# Patient Record
Sex: Male | Born: 1975
Health system: Southern US, Community
[De-identification: ages and names within clinical notes are randomized; demographics above are authoritative.]

---

## 2018-01-01 ENCOUNTER — Telehealth: Payer: Self-pay | Admitting: *Deleted

## 2018-01-01 ENCOUNTER — Encounter: Payer: Self-pay | Admitting: Emergency Medicine

## 2018-01-01 ENCOUNTER — Other Ambulatory Visit: Payer: Self-pay

## 2018-01-01 ENCOUNTER — Ambulatory Visit (INDEPENDENT_AMBULATORY_CARE_PROVIDER_SITE_OTHER): Payer: 59 | Admitting: Emergency Medicine

## 2018-01-01 ENCOUNTER — Ambulatory Visit (INDEPENDENT_AMBULATORY_CARE_PROVIDER_SITE_OTHER): Payer: 59

## 2018-01-01 VITALS — BP 131/87 | HR 83 | Temp 98.8°F | Resp 16 | Ht 70.25 in | Wt 238.2 lb

## 2018-01-01 DIAGNOSIS — S92902A Unspecified fracture of left foot, initial encounter for closed fracture: Secondary | ICD-10-CM

## 2018-01-01 DIAGNOSIS — S99922A Unspecified injury of left foot, initial encounter: Secondary | ICD-10-CM

## 2018-01-01 NOTE — Patient Instructions (Addendum)
IF you received an x-ray today, you will receive an invoice from [facility]. Please contact [facility] at [phone] with questions or concerns regarding your invoice.   IF you received labwork today, you will receive an invoice from [facility]. Please contact [facility] at [phone] with questions or concerns regarding your invoice.   Our billing staff will not be able to assist you with questions regarding bills from these companies.  You will be contacted with the lab results as soon as they are available. The fastest way to get your results is to activate your My Chart account. Instructions are located on the last page of this paperwork. If you have not heard from us regarding the results in 2 weeks, please contact this office.    Cuneiform Fracture A cuneiform fracture is a break in a cuneiform bone. Cuneiform bones are the bones in the middle of the foot that make up the arch. What are the causes? This type of fracture may be caused by:  A sudden twisting of your foot.  A fall onto your foot.  Dropping a heavy object on your foot.  Overuse or repetitive exercise.  What increases the risk? This condition is more likely to develop in people who:  Play contact sports.  Have a bone disease.  Have a low calcium level.  What are the signs or symptoms? Symptoms of this condition include:  Pain. The pain may be worse when you walk, stand, or move your foot.  Swelling, especially on the top of the foot.  Bruising, especially on the bottom of the foot.  How is this diagnosed? This condition is diagnosed with a physical exam. You may also have imaging tests, such as:  X-rays.  A CT scan.  MRI.  How is this treated? Treatment for this condition depends on its severity and whether a bone has moved out of place. Treatment may involve:  Rest.  Wearing foot support such as a cast, splint, boot, or brace for several weeks.  Using  crutches.  Surgery to move bones back into the right position. Surgery is usually needed if there are many pieces of broken bone or bones that are very out of place (displaced fracture).  Physical therapy. This may be needed to help you regain full movement and strength in your foot.  You will need to return to your health care provider to have X-rays taken until your bones heal. Your health care provider will look at the X-rays to make sure that your foot is healing well. Follow these instructions at home: If you have a cast:  Do not stick anything inside the cast to scratch your skin. Doing that increases your risk of infection.  Check the skin around the cast every day. Report any concerns to your health care provider. You may put lotion on dry skin around the edges of the cast. Do not apply lotion to the skin underneath the cast.  Keep the cast clean and dry. If You Have Other Foot Support :  Wear it as directed by your health care provider. Remove it only as directed by your health care provider.  Loosen it if your toes become numb and tingle, or if they turn cold and blue.  Keep it clean and dry. Bathing  Do not take baths, swim, or use a hot tub until your health care provider approves. Ask your health care provider if you can take showers. You may only be allowed to take sponge baths  for bathing.  If your health care provider approves bathing and showering, cover the cast or splint with a watertight plastic bag to protect it from water. Do not let the cast or splint get wet. Managing pain, stiffness, and swelling  If directed, apply ice to the injured area (if you have a splint, not a cast). ? Put ice in a plastic bag. ? Place a towel between your skin and the bag. ? Leave the ice on for 20 minutes, 2-3 times per day.  Move your toes often to avoid stiffness and to lessen swelling.  Raise (elevate) the injured area above the level of your heart while you are sitting or  lying down. Driving  Do not drive or operate heavy machinery while taking pain medicine.  Do not drive while wearing foot support on a foot that you use for driving. Activity  Return to your normal activities as directed by your health care provider. Ask your health care provider what activities are safe for you.  Perform exercises as directed by your health care provider or physical therapist. Safety  Do not use the injured foot to support your body weight until your health care provider says that you can. Use crutches as directed by your health care provider. General instructions  Do not put pressure on any part of the cast or splint until it is fully hardened. This may take several hours.  Do not use any tobacco products, including cigarettes, chewing tobacco, or e-cigarettes. Tobacco can delay bone healing. If you need help quitting, ask your health care provider.  Take medicines only as directed by your health care provider.  Keep all follow-up visits as directed by your health care provider. This is important. Contact a health care provider if:  You have a fever.  Your cast, splint, boot, or brace is too loose or too tight.  Your cast, splint, boot, or brace is damaged.  Your pain medicine is not helping. Get help right away if:  You have severe pain.  You have tingling or numbness in your foot that is getting worse.  You have redness or inflammation in your foot that is getting worse.  Your foot or your lower leg swells up.  Your foot feels cold or becomes numb.  Your foot changes color. This information is not intended to replace advice given to you by your health care provider. Make sure you discuss any questions you have with your health care provider. Document Released: 06/16/2002 Document Revised: 03/01/2016 Document Reviewed: 07/22/2014 Elsevier Interactive Patient Education  2018 [employer].

## 2018-01-01 NOTE — Telephone Encounter (Signed)
Faxed Rx for post-op shoe to [facility]. Conformation page received at 3:50 pm.

## 2018-01-01 NOTE — Telephone Encounter (Signed)
[facility] calling in regards to an order they received for pt on a post op shoe. They do not provide this equipment.

## 2018-01-01 NOTE — Progress Notes (Signed)
Daily Crate 42 y.o.   Chief Complaint  Patient presents with  . Fall    12/31/2017  . Foot Injury    LEFT FOOT    HISTORY OF PRESENT ILLNESS: This is a 42 y.o. male injured left foot yesterday complaining of pain and swelling since.  Foot Injury   The incident occurred 12 to 24 hours ago. The injury mechanism was a twisting injury. The pain is present in the left foot. The quality of the pain is described as aching. The pain is at a severity of 4/10. The pain is moderate. The pain has been constant since onset. Associated symptoms include an inability to bear weight. Pertinent negatives include no loss of sensation, numbness or tingling. The symptoms are aggravated by weight bearing. He has tried elevation, ice, non-weight bearing and rest for the symptoms. The treatment provided mild relief.     Prior to Admission medications   Medication Sig Start Date End Date Taking? Authorizing Provider  ibuprofen (ADVIL,MOTRIN) 200 MG tablet Take 200 mg by mouth as needed.   Yes [provider]    No Known Allergies  There are no active problems to display for this patient.   No past medical history on file.    Social History   Socioeconomic History  . Marital status: Married    Spouse name: Not on file  . Number of children: Not on file  . Years of education: Not on file  . Highest education level: Not on file  Occupational History  . Not on file  Social Needs  . Financial resource strain: Not on file  . Food insecurity:    Worry: Not on file    Inability: Not on file  . Transportation needs:    Medical: Not on file    Non-medical: Not on file  Tobacco Use  . Smoking status: Never Smoker  . Smokeless tobacco: Never Used  Substance and Sexual Activity  . Alcohol use: Yes    Alcohol/week: 6.0 oz    Types: 10 Standard drinks or equivalent per week  . Drug use: Never  . Sexual activity: Not on file  Lifestyle  . Physical activity:    Days per week: Not on  file    Minutes per session: Not on file  . Stress: Not on file  Relationships  . Social connections:    Talks on phone: Not on file    Gets together: Not on file    Attends religious service: Not on file    Active member of club or organization: Not on file    Attends meetings of clubs or organizations: Not on file    Relationship status: Not on file  . Intimate partner violence:    Fear of current or ex partner: Not on file    Emotionally abused: Not on file    Physically abused: Not on file    Forced sexual activity: Not on file  Other Topics Concern  . Not on file  Social History Narrative  . Not on file    No family history on file.   Review of Systems  Constitutional: Negative.  Negative for chills and fever.  Respiratory: Negative for shortness of breath.   Gastrointestinal: Negative for nausea and vomiting.  Musculoskeletal: Positive for joint pain (left foot).  Neurological: Negative for tingling, numbness and headaches.  All other systems reviewed and are negative.    Physical Exam  Constitutional: He is oriented to person, place, and time. He appears  well-developed and well-nourished.  HENT:  Head: Normocephalic.  Eyes: Pupils are equal, round, and reactive to light. EOM are normal.  Neck: Normal range of motion.  Pulmonary/Chest: Effort normal.  Musculoskeletal:  Left foot: Positive swelling with tenderness some mild bruising mostly to lateral aspect.  Full range of motion.  NVI.  Neurological: He is alert and oriented to person, place, and time.  Vitals reviewed.   Dg Foot Complete Left  Result Date: 01/01/2018 CLINICAL DATA:  Left foot injury. EXAM: LEFT FOOT - COMPLETE 3+ VIEW COMPARISON:  None. FINDINGS: Probable mildly displaced fracture involving the medial cuneiform bone. There is no evidence of arthropathy or other focal bone abnormality. Soft tissues are unremarkable. IMPRESSION: Probable mildly displaced fracture involving medial cuneiform bone.  Clinical correlation is recommended. Electronically Signed   By: Lupita Raider, M.D.   On: 01/01/2018 14:26    ASSESSMENT & PLAN: Marquee was seen today for fall and foot injury.  Diagnoses and all orders for this visit:  Injury of left foot, initial encounter -     DG Foot Complete Left; Future  Foot fracture, left, closed, initial encounter -     Ambulatory referral to Orthopedic Surgery -     Post-op shoe -     Apply ace wrap   Patient Instructions       IF you received an x-ray today, you will receive an invoice from [facility]. Please contact [facility] at [phone] with questions or concerns regarding your invoice.   IF you received labwork today, you will receive an invoice from [facility]. Please contact [facility] at [phone] with questions or concerns regarding your invoice.   Our billing staff will not be able to assist you with questions regarding bills from these companies.  You will be contacted with the lab results as soon as they are available. The fastest way to get your results is to activate your My Chart account. Instructions are located on the last page of this paperwork. If you have not heard from [place] regarding the results in 2 weeks, please contact this office.    Cuneiform Fracture A cuneiform fracture is a break in a cuneiform bone. Cuneiform bones are the bones in the middle of the foot that make up the arch. What are the causes? This type of fracture may be caused by:  A sudden twisting of your foot.  A fall onto your foot.  Dropping a heavy object on your foot.  Overuse or repetitive exercise.  What increases the risk? This condition is more likely to develop in people who:  Play contact sports.  Have a bone disease.  Have a low calcium level.  What are the signs or symptoms? Symptoms of this condition include:  Pain. The pain may be worse when you walk, stand, or move your foot.  Swelling, especially on the  top of the foot.  Bruising, especially on the bottom of the foot.  How is this diagnosed? This condition is diagnosed with a physical exam. You may also have imaging tests, such as:  X-rays.  A CT scan.  MRI.  How is this treated? Treatment for this condition depends on its severity and whether a bone has moved out of place. Treatment may involve:  Rest.  Wearing foot support such as a cast, splint, boot, or brace for several weeks.  Using crutches.  Surgery to move bones back into the right position. Surgery is usually needed if there are many pieces of broken bone  or bones that are very out of place (displaced fracture).  Physical therapy. This may be needed to help you regain full movement and strength in your foot.  You will need to return to your health care provider to have X-rays taken until your bones heal. Your health care provider will look at the X-rays to make sure that your foot is healing well. Follow these instructions at home: If you have a cast:  Do not stick anything inside the cast to scratch your skin. Doing that increases your risk of infection.  Check the skin around the cast every day. Report any concerns to your health care provider. You may put lotion on dry skin around the edges of the cast. Do not apply lotion to the skin underneath the cast.  Keep the cast clean and dry. If You Have Other Foot Support :  Wear it as directed by your health care provider. Remove it only as directed by your health care provider.  Loosen it if your toes become numb and tingle, or if they turn cold and blue.  Keep it clean and dry. Bathing  Do not take baths, swim, or use a hot tub until your health care provider approves. Ask your health care provider if you can take showers. You may only be allowed to take sponge baths for bathing.  If your health care provider approves bathing and showering, cover the cast or splint with a watertight plastic bag to protect it  from water. Do not let the cast or splint get wet. Managing pain, stiffness, and swelling  If directed, apply ice to the injured area (if you have a splint, not a cast). ? Put ice in a plastic bag. ? Place a towel between your skin and the bag. ? Leave the ice on for 20 minutes, 2-3 times per day.  Move your toes often to avoid stiffness and to lessen swelling.  Raise (elevate) the injured area above the level of your heart while you are sitting or lying down. Driving  Do not drive or operate heavy machinery while taking pain medicine.  Do not drive while wearing foot support on a foot that you use for driving. Activity  Return to your normal activities as directed by your health care provider. Ask your health care provider what activities are safe for you.  Perform exercises as directed by your health care provider or physical therapist. Safety  Do not use the injured foot to support your body weight until your health care provider says that you can. Use crutches as directed by your health care provider. General instructions  Do not put pressure on any part of the cast or splint until it is fully hardened. This may take several hours.  Do not use any tobacco products, including cigarettes, chewing tobacco, or e-cigarettes. Tobacco can delay bone healing. If you need help quitting, ask your health care provider.  Take medicines only as directed by your health care provider.  Keep all follow-up visits as directed by your health care provider. This is important. Contact a health care provider if:  You have a fever.  Your cast, splint, boot, or brace is too loose or too tight.  Your cast, splint, boot, or brace is damaged.  Your pain medicine is not helping. Get help right away if:  You have severe pain.  You have tingling or numbness in your foot that is getting worse.  You have redness or inflammation in your foot that is getting worse.  Your foot or your lower leg  swells up.  Your foot feels cold or becomes numb.  Your foot changes color. This information is not intended to replace advice given to you by your health care provider. Make sure you discuss any questions you have with your health care provider. Document Released: 06/16/2002 Document Revised: 03/01/2016 Document Reviewed: 07/22/2014 Elsevier Interactive Patient Education  2018 Elsevier Inc.      Edwina BarthMiguel Tippi Mccrae, MD [facility]

## 2018-01-02 NOTE — Telephone Encounter (Signed)
Please advise on this.  

## 2018-01-07 ENCOUNTER — Ambulatory Visit (INDEPENDENT_AMBULATORY_CARE_PROVIDER_SITE_OTHER): Payer: 59 | Admitting: Orthopaedic Surgery

## 2018-01-07 ENCOUNTER — Encounter (INDEPENDENT_AMBULATORY_CARE_PROVIDER_SITE_OTHER): Payer: Self-pay | Admitting: Orthopaedic Surgery

## 2018-01-07 VITALS — BP 133/99 | HR 79 | Resp 16 | Ht 70.0 in | Wt 238.0 lb

## 2018-01-07 DIAGNOSIS — S92902A Unspecified fracture of left foot, initial encounter for closed fracture: Secondary | ICD-10-CM

## 2018-01-07 DIAGNOSIS — S92242A Displaced fracture of medial cuneiform of left foot, initial encounter for closed fracture: Secondary | ICD-10-CM | POA: Diagnosis not present

## 2018-01-07 NOTE — Progress Notes (Deleted)
   Office Visit Note   Patient: Victor Ortega           Date of Birth: 06-25-1976           MRN: [id number] Visit Date: 01/07/2018              Requested by: Georgina QuintSagardia, Miguel Jose, MD [address] [place], [place] [id number] PCP: Georgina QuintSagardia, Miguel Jose, MD   Assessment & Plan: Visit Diagnoses:  1. Closed displaced fracture of medial cuneiform of left foot, initial encounter     Plan: ***  Follow-Up Instructions: No follow-ups on file.   Orders:  No orders of the defined types were placed in this encounter.  No orders of the defined types were placed in this encounter.     Procedures: No procedures performed   Clinical Data: No additional findings.   Subjective: Chief Complaint  Patient presents with  . Left Foot - Edema, Injury, Pain, Fracture    HPI  Review of Systems  Constitutional: Negative for fatigue and fever.  HENT: Negative for ear pain.   Eyes: Negative for pain.  Respiratory: Negative for cough and shortness of breath.   Cardiovascular: Negative for leg swelling.  Gastrointestinal: Negative for blood in stool, constipation and diarrhea.  Genitourinary: Negative for difficulty urinating.  Musculoskeletal: Negative for back pain and neck pain.  Skin: Negative for rash and wound.  Allergic/Immunologic: Negative for food allergies.  Neurological: Positive for weakness. Negative for dizziness, light-headedness, numbness and headaches.  Hematological: Does not bruise/bleed easily.  Psychiatric/Behavioral: Positive for sleep disturbance.     Objective: Vital Signs: Resp 16   Ht 5\' 10"  (1.778 m)   Wt 238 lb (108 kg)   BMI 34.15 kg/m   Physical Exam  Ortho Exam  Specialty Comments:  No specialty comments available.  Imaging: No results found.   PMFS History: Patient Active Problem List   Diagnosis Date Noted  . Foot fracture, left, closed, initial encounter 01/01/2018   No past medical history on file.  No family history on file.  ***  The histories are not reviewed yet. Please review them in the "History" navigator section and refresh this SmartLink. Social History   Occupational History  . Not on file  Tobacco Use  . Smoking status: Never Smoker  . Smokeless tobacco: Never Used  Substance and Sexual Activity  . Alcohol use: Yes    Alcohol/week: 6.0 oz    Types: 10 Standard drinks or equivalent per week  . Drug use: Never  . Sexual activity: Not on file
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
## 2018-01-08 ENCOUNTER — Ambulatory Visit
Admission: RE | Admit: 2018-01-08 | Discharge: 2018-01-08 | Disposition: A | Discharge status: Home / Self Care | Payer: 59 | Source: Ambulatory Visit | Attending: Orthopedic Surgery | Admitting: Orthopedic Surgery

## 2018-01-08 DIAGNOSIS — S92902A Unspecified fracture of left foot, initial encounter for closed fracture: Secondary | ICD-10-CM

## 2018-01-08 DIAGNOSIS — S92245A Nondisplaced fracture of medial cuneiform of left foot, initial encounter for closed fracture: Secondary | ICD-10-CM | POA: Diagnosis not present

## 2018-01-15 ENCOUNTER — Encounter (INDEPENDENT_AMBULATORY_CARE_PROVIDER_SITE_OTHER): Payer: Self-pay | Admitting: Orthopedic Surgery

## 2018-01-15 ENCOUNTER — Ambulatory Visit (INDEPENDENT_AMBULATORY_CARE_PROVIDER_SITE_OTHER): Payer: 59 | Admitting: Orthopedic Surgery

## 2018-01-15 VITALS — BP 125/89 | HR 82 | Resp 14 | Ht 70.0 in | Wt 238.0 lb

## 2018-01-15 DIAGNOSIS — S92325D Nondisplaced fracture of second metatarsal bone, left foot, subsequent encounter for fracture with routine healing: Secondary | ICD-10-CM

## 2018-01-15 DIAGNOSIS — S92245D Nondisplaced fracture of medial cuneiform of left foot, subsequent encounter for fracture with routine healing: Secondary | ICD-10-CM | POA: Diagnosis not present

## 2018-01-15 NOTE — Progress Notes (Signed)
Office Visit Note   Patient: Victor Ortega           Date of Birth: Dec 18, 1975           MRN: [id number] Visit Date: 01/15/2018              Requested by: Victor Ortega, Victor Jose, MD [address] [place], [place] [id number] PCP: Victor Ortega, Victor Jose, MD   Assessment & Plan: Visit Diagnoses:  1. Closed nondisplaced fracture of medial cuneiform of left foot with routine healing, subsequent encounter   2. Closed nondisplaced fracture of second metatarsal bone of left foot with routine healing, subsequent encounter     Plan #1: Continue equalizer boot nonweightbearing #2: Follow back up in 3 weeks prior to his trip for recheck x-ray and hopefully at that time possibly begin some weightbearing.  Follow-Up Instructions: Return in about 3 weeks (around 02/05/2018).   Orders:  No orders of the defined types were placed in this encounter.  No orders of the defined types were placed in this encounter.     Procedures: No procedures performed   Clinical Data: No additional findings.   Subjective: Chief Complaint  Patient presents with  . Left Foot - Pain    HPI  Victor Ortega returns today for follow-up of his fracture of the medial cuneiform of his left foot.  Saw him back on 01/07/2018 and at that time ordered an CT scan and placed him into an equalizer boot.  Been using a knee scooter.  He is continued his activities of daily living.  Tolerating the boot well.  In today for evaluation and review of his CT scan.  Review of Systems  All other systems reviewed and are negative.    Objective: Vital Signs: BP 125/89 (BP Location: Right Arm, Patient Position: Sitting, Cuff Size: Normal)   Pulse 82   Resp 14   Ht 5\' 10"  (1.778 m)   Wt 238 lb (108 kg)   BMI 34.15 kg/m   Physical Exam  Constitutional: He is oriented to person, place, and time. He appears well-developed and well-nourished.  HENT:  Head: Normocephalic and atraumatic.  Eyes: Pupils are equal, round, and reactive to  light. EOM are normal.  Pulmonary/Chest: Effort normal.  Neurological: He is alert and oriented to person, place, and time.  Skin: Skin is warm and dry.  Psychiatric: He has a normal mood and affect. His behavior is normal. Judgment and thought content normal.    Ortho Exam  Less pain otherwise unchanged.  Specialty Comments:  No specialty comments available.  Imaging:  Ct Foot Left Wo Contrast  Result Date: 01/08/2018 CLINICAL DATA:  Left foot fracture.  Injured 1 week ago. EXAM: CT OF THE LEFT FOOT WITHOUT CONTRAST TECHNIQUE: Multidetector CT imaging of the left foot was performed according to the standard protocol. Multiplanar CT image reconstructions were also generated. COMPARISON:  None. FINDINGS: Bones/Joint/Cartilage Acute comminuted nondisplaced fracture of the medial cuneiform. Small bony fragments along the plantar medial aspect of the second metatarsal base concerning for an avulsion fracture at the Lisfranc ligament insertion Normal alignment. No other fracture or dislocation. Ligaments Suboptimally assessed by CT. Muscles and Tendons Muscles are normal. Flexor, extensor, peroneal and Achilles tendons are grossly intact. Soft tissues No fluid collection or hematoma. IMPRESSION: 1. Acute comminuted nondisplaced fracture of the medial cuneiform. 2. Small bony fragments along the plantar medial aspect of the second metatarsal base concerning for an avulsion fracture at the Lisfranc ligament insertion Electronically Signed   By:  Elige Ko   On: 01/08/2018 11:24   Dg Foot Complete Left  Result Date: 01/01/2018 CLINICAL DATA:  Left foot injury. EXAM: LEFT FOOT - COMPLETE 3+ VIEW COMPARISON:  None. FINDINGS: Probable mildly displaced fracture involving the medial cuneiform bone. There is no evidence of arthropathy or other focal bone abnormality. Soft tissues are unremarkable. IMPRESSION: Probable mildly displaced fracture involving medial cuneiform bone. Clinical correlation is  recommended. Electronically Signed   By: Lupita Raider, M.D.   On: 01/01/2018 14:26    PMFS History: Patient Active Problem List   Diagnosis Date Noted  . Foot fracture, left, closed, initial encounter 01/01/2018   History reviewed. No pertinent past medical history.  History reviewed. No pertinent family history.  History reviewed. No pertinent surgical history. Social History   Occupational History  . Not on file  Tobacco Use  . Smoking status: Never Smoker  . Smokeless tobacco: Never Used  Substance and Sexual Activity  . Alcohol use: Yes    Alcohol/week: 6.0 oz    Types: 10 Standard drinks or equivalent per week  . Drug use: Never  . Sexual activity: Not on file

## 2018-01-30 ENCOUNTER — Encounter (INDEPENDENT_AMBULATORY_CARE_PROVIDER_SITE_OTHER): Payer: Self-pay | Admitting: Orthopaedic Surgery

## 2018-01-30 ENCOUNTER — Ambulatory Visit (INDEPENDENT_AMBULATORY_CARE_PROVIDER_SITE_OTHER): Payer: 59

## 2018-01-30 ENCOUNTER — Ambulatory Visit (INDEPENDENT_AMBULATORY_CARE_PROVIDER_SITE_OTHER): Payer: 59 | Admitting: Orthopaedic Surgery

## 2018-01-30 VITALS — BP 142/102 | HR 81 | Resp 18 | Ht 70.0 in | Wt 238.0 lb

## 2018-01-30 DIAGNOSIS — M25572 Pain in left ankle and joints of left foot: Secondary | ICD-10-CM

## 2018-01-30 DIAGNOSIS — S92245D Nondisplaced fracture of medial cuneiform of left foot, subsequent encounter for fracture with routine healing: Secondary | ICD-10-CM

## 2018-01-30 NOTE — Progress Notes (Signed)
Office Visit Note   Patient: Victor Ortega           Date of Birth: October 04, 1976           MRN: [id number] Visit Date: 01/30/2018              Requested by: Georgina Quint, MD [address] [place], [place] [address] PCP: Georgina Quint, MD   Assessment & Plan: Visit Diagnoses:  1. Closed nondisplaced fracture of medial cuneiform of left foot with routine healing, subsequent encounter   2. Pain in left ankle and joints of left foot     Plan:  #1: Begin graduated weightbearing in Chartered loss adjuster. #2: As he becomes comfortable at about 5 to 6 weeks then he can start to go full weightbearing.   Follow-Up Instructions: Return in about 1 month (around 02/27/2018).   Orders:  Orders Placed This Encounter  Procedures  . XR Foot Complete Left   No orders of the defined types were placed in this encounter.     Procedures: No procedures performed   Clinical Data: No additional findings.   Subjective: Chief Complaint  Patient presents with  . Left Foot - Pain  . Follow-up    L FOOT PAIN STARTED GETTING BETTER BUT NOW HAVING PAIN FOR 24 HRS. NO NEW INJURY    HPI  Mr. Victor Ortega is a 42 year old white male who is seen today for follow-up of his left foot medial cuneiform fracture.  He is doing well.  He is using his scooter.  Nonweightbearing.  He did have an episode of about 20 hours of pain in the distal fourth fifth metatarsal area which has abated.  She was just most recently.  Review of Systems  Constitutional: Negative for fatigue and fever.  HENT: Negative for ear pain.   Eyes: Negative for pain.  Respiratory: Negative for cough and shortness of breath.   Cardiovascular: Positive for leg swelling.  Gastrointestinal: Negative for constipation and diarrhea.  Genitourinary: Negative for difficulty urinating.  Musculoskeletal: Negative for back pain and neck pain.  Skin: Negative for rash.  Allergic/Immunologic: Negative for food allergies.  Neurological:  Negative for weakness and numbness.  Hematological: Does not bruise/bleed easily.  Psychiatric/Behavioral: Positive for sleep disturbance.     Objective: Vital Signs: BP (!) 142/102 (BP Location: Right Arm, Patient Position: Sitting, Cuff Size: Normal)   Pulse 81   Resp 18   Ht 5\' 10"  (1.778 m)   Wt 238 lb (108 kg)   BMI 34.15 kg/m   Physical Exam  Constitutional: He is oriented to person, place, and time. He appears well-developed and well-nourished.  HENT:  Mouth/Throat: Oropharynx is clear and moist.  Eyes: Pupils are equal, round, and reactive to light. EOM are normal.  Pulmonary/Chest: Effort normal.  Neurological: He is alert and oriented to person, place, and time.  Skin: Skin is warm and dry.  Psychiatric: He has a normal mood and affect. His behavior is normal.    Ortho Exam  Today he has no pain to palpation over the area of where the fracture was nor over the distal third fourth and fifth metatarsophalangeal joints.  Swelling is noted at the distal foot.  Motor is intact.  Sensations intact.  Specialty Comments:  No specialty comments available.  Imaging: Xr Foot Complete Left  Result Date: 01/30/2018 X-rays today reveals maintenance of the fracture fragment.  It appears similar to the original film.  Dr. Cleophas Dunker agreed.   Ct Foot Left Wo  Contrast  Result Date: 01/08/2018 CLINICAL DATA:  Left foot fracture.  Injured 1 week ago. EXAM: CT OF THE LEFT FOOT WITHOUT CONTRAST TECHNIQUE: Multidetector CT imaging of the left foot was performed according to the standard protocol. Multiplanar CT image reconstructions were also generated. COMPARISON:  None. FINDINGS: Bones/Joint/Cartilage Acute comminuted nondisplaced fracture of the medial cuneiform. Small bony fragments along the plantar medial aspect of the second metatarsal base concerning for an avulsion fracture at the Lisfranc ligament insertion Normal alignment. No other fracture or dislocation. Ligaments Suboptimally  assessed by CT. Muscles and Tendons Muscles are normal. Flexor, extensor, peroneal and Achilles tendons are grossly intact. Soft tissues No fluid collection or hematoma. IMPRESSION: 1. Acute comminuted nondisplaced fracture of the medial cuneiform. 2. Small bony fragments along the plantar medial aspect of the second metatarsal base concerning for an avulsion fracture at the Lisfranc ligament insertion Electronically Signed   By: Elige KoHetal  Patel   On: 01/08/2018 11:24   Dg Foot Complete Left  Result Date: 01/01/2018 CLINICAL DATA:  Left foot injury. EXAM: LEFT FOOT - COMPLETE 3+ VIEW COMPARISON:  None. FINDINGS: Probable mildly displaced fracture involving the medial cuneiform bone. There is no evidence of arthropathy or other focal bone abnormality. Soft tissues are unremarkable. IMPRESSION: Probable mildly displaced fracture involving medial cuneiform bone. Clinical correlation is recommended. Electronically Signed   By: Lupita RaiderJames  Green Jr, M.D.   On: 01/01/2018 14:26   Xr Foot Complete Left  Result Date: 01/30/2018 X-rays today reveals maintenance of the fracture fragment.  It appears similar to the original film.  Dr. Cleophas DunkerWhitfield agreed.    PMFS History: Patient Active Problem List   Diagnosis Date Noted  . Foot fracture, left, closed, initial encounter 01/01/2018   History reviewed. No pertinent past medical history.  History reviewed. No pertinent family history.  History reviewed. No pertinent surgical history. Social History   Occupational History  . Not on file  Tobacco Use  . Smoking status: Never Smoker  . Smokeless tobacco: Never Used  Substance and Sexual Activity  . Alcohol use: Yes    Alcohol/week: 6.0 oz    Types: 10 Standard drinks or equivalent per week  . Drug use: Never  . Sexual activity: Not on file

## 2018-01-31 ENCOUNTER — Ambulatory Visit (INDEPENDENT_AMBULATORY_CARE_PROVIDER_SITE_OTHER): Payer: 59 | Admitting: Orthopaedic Surgery

## 2019-05-03 IMAGING — DX DG FOOT COMPLETE 3+V*L*
3 series · 3 of 3 positions shown · non-contrast
Comparison: None.

CLINICAL DATA: Left foot injury.

EXAM:
LEFT FOOT - COMPLETE 3+ VIEW

[foot ap]
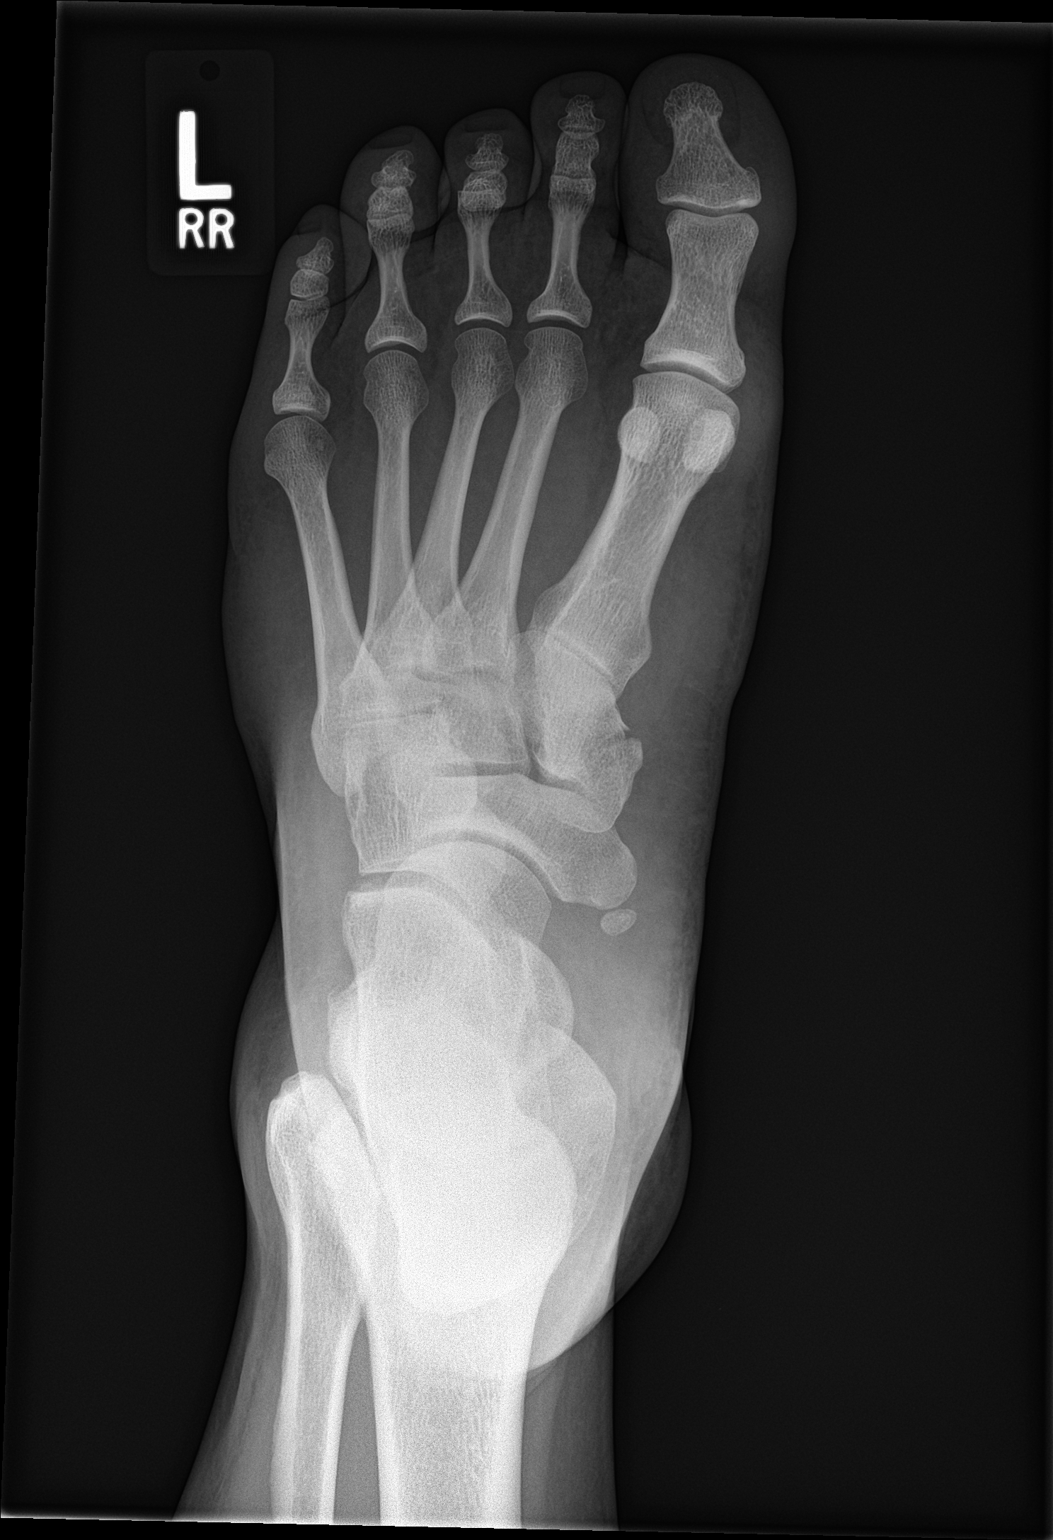

[foot obl]
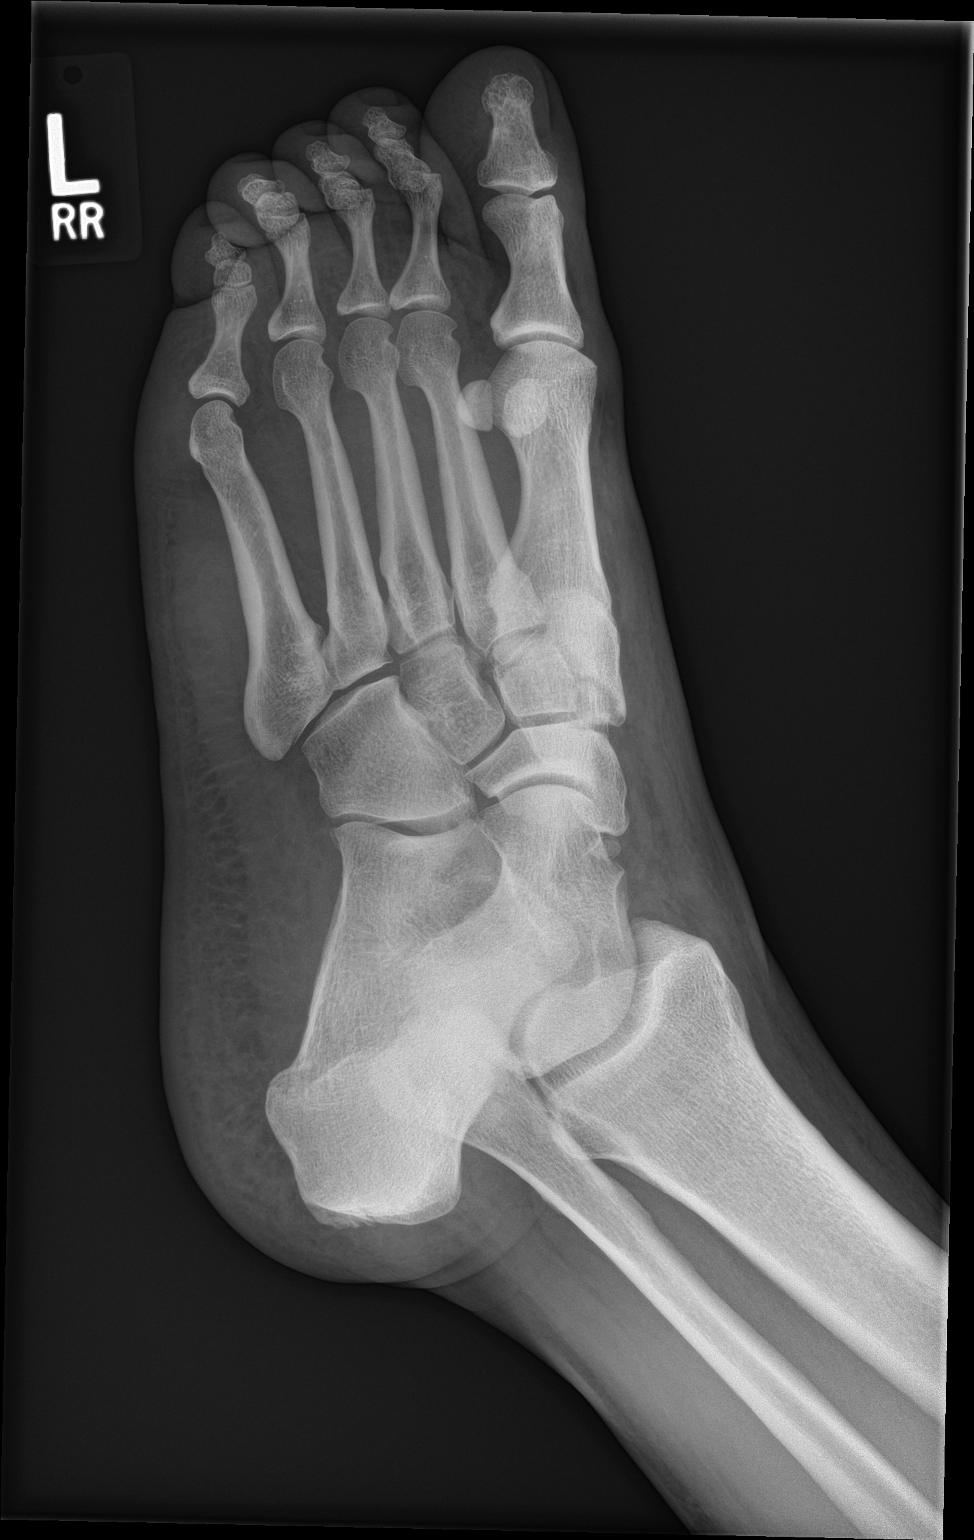

[foot lat]
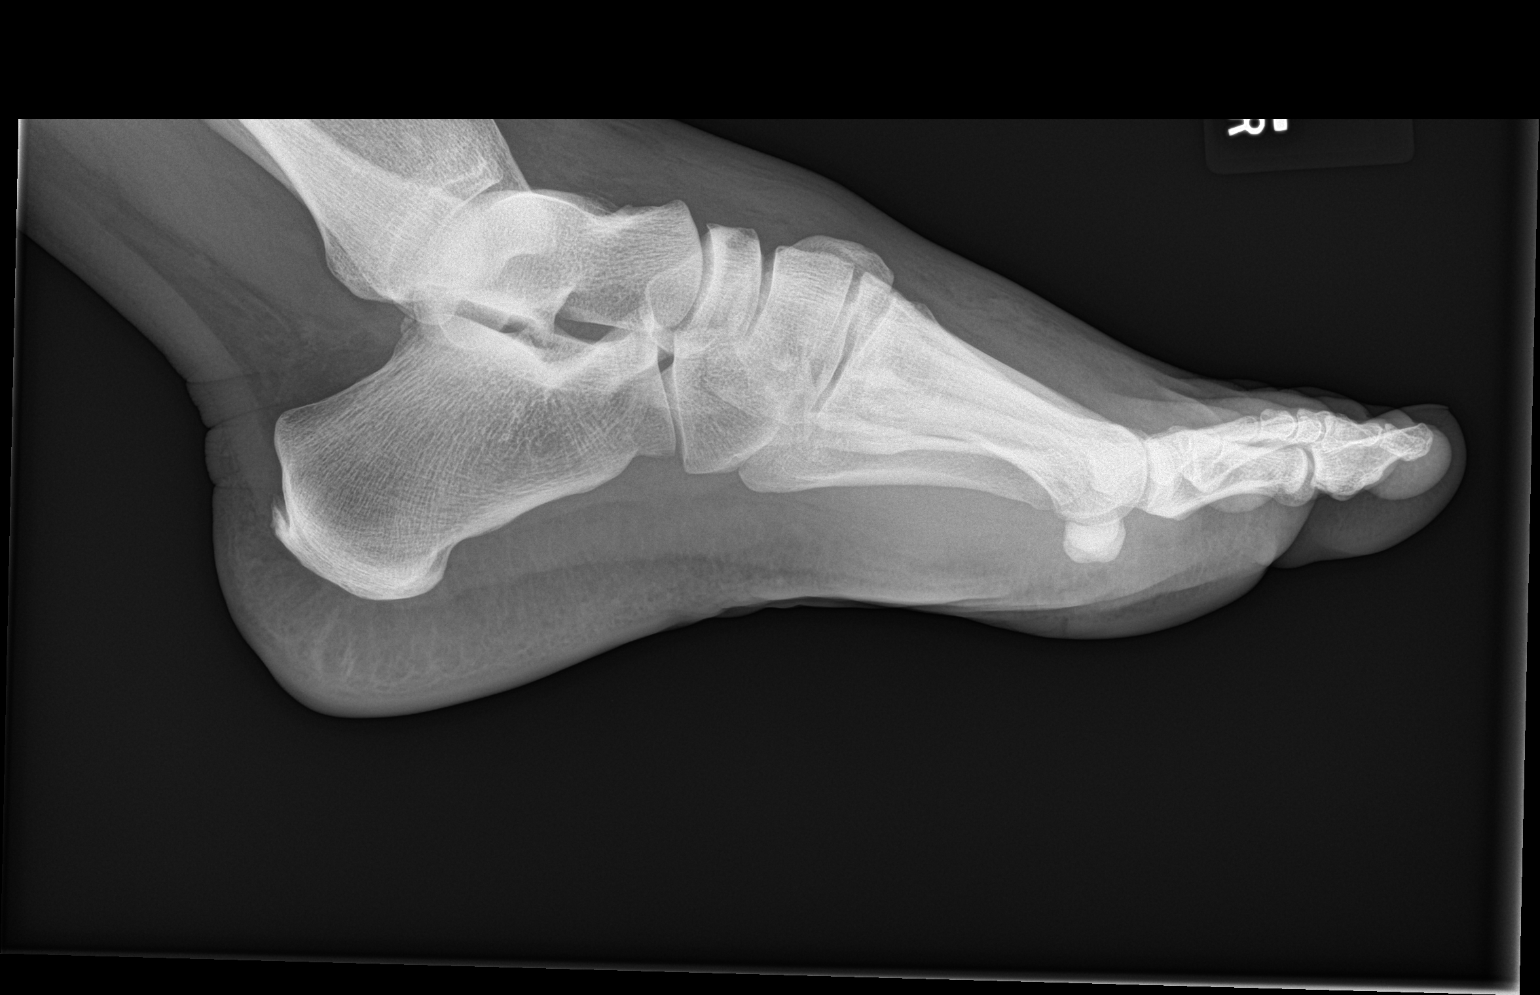

[3 of 3 positions shown; findings below may reference images not displayed]

FINDINGS: Probable mildly displaced fracture involving the medial cuneiform
bone. There is no evidence of arthropathy or other focal bone
abnormality. Soft tissues are unremarkable.
IMPRESSION: Probable mildly displaced fracture involving medial cuneiform bone.
Clinical correlation is recommended.

## 2019-05-10 IMAGING — CT CT FOOT*L* W/O CM
2 of 6 series · 6 of 18 positions shown · non-contrast
Comparison: None.

CLINICAL DATA: Left foot fracture.  Injured 1 week ago.

EXAM:
CT OF THE LEFT FOOT WITHOUT CONTRAST
TECHNIQUE: Multidetector CT imaging of the left foot was performed according to
the standard protocol. Multiplanar CT image reconstructions were
also generated.

[Series 5: sfov lower extremity 2.00 br60 s3 cor · coronal · 0.27mm/px · 3 of 145 slices shown (1 of 2)]
[im 44/145  bone]
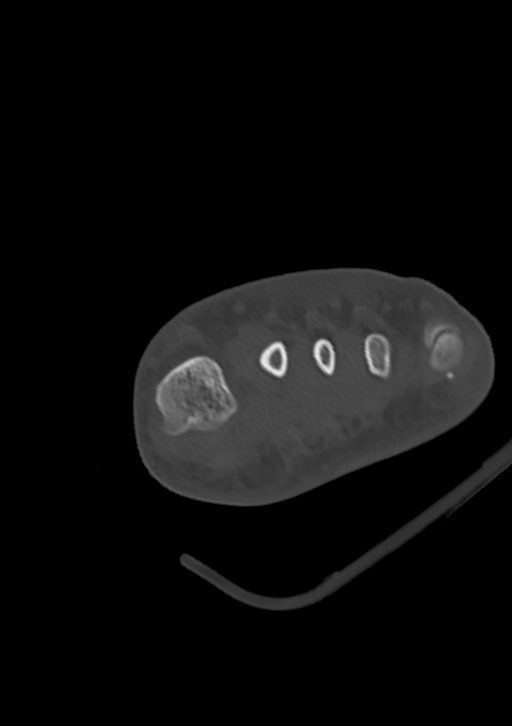
[im 73/145  bone]
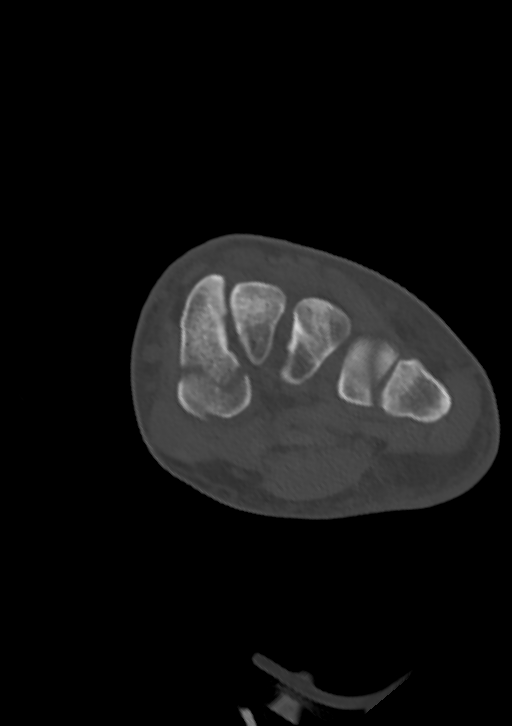
[im 102/145  bone]
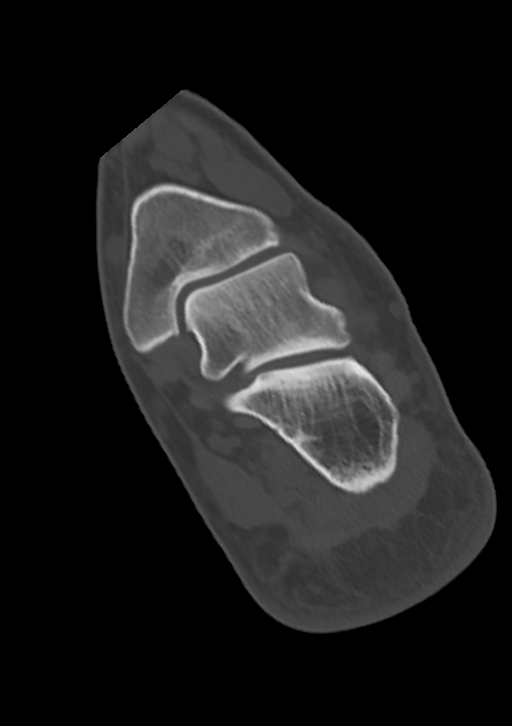

[Series 11: sfov lower extremity 2.00 br60 s3 cor · coronal · 0.27mm/px · 3 of 145 slices shown (2 of 2)]
[im 44/145  bone]
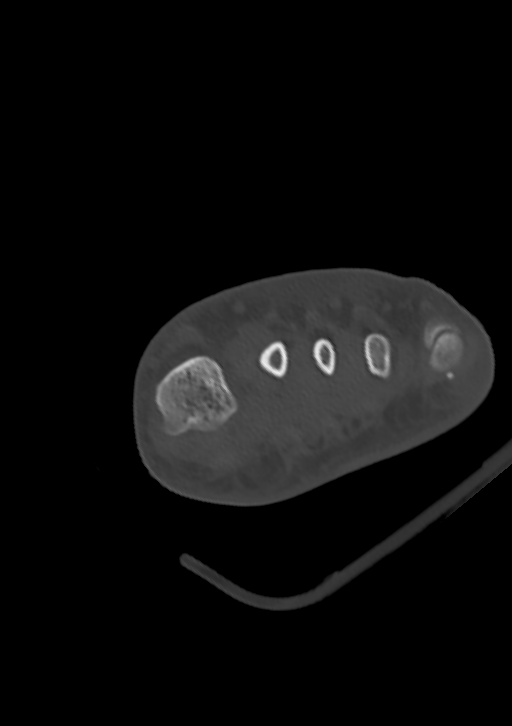
[im 73/145  bone]
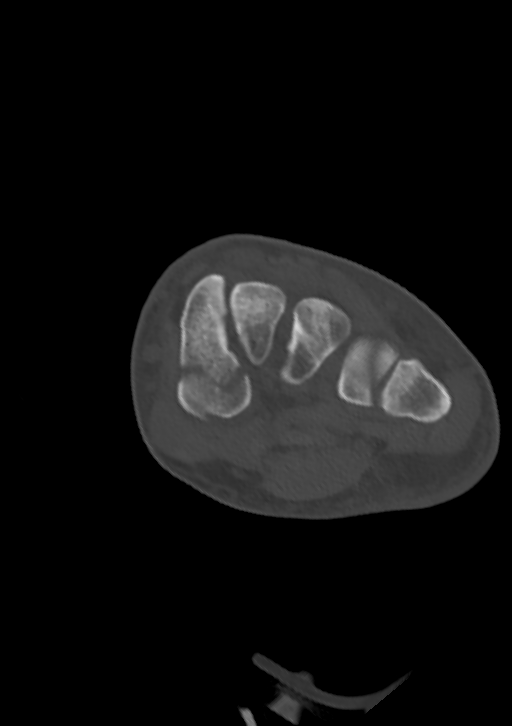
[im 102/145  bone]
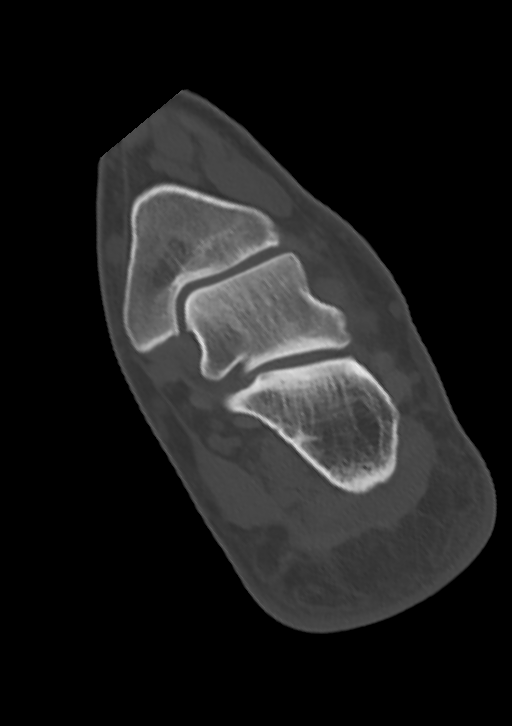

[6 of 18 positions shown; findings below may reference images not displayed]

FINDINGS: Bones/Joint/Cartilage

Acute comminuted nondisplaced fracture of the medial cuneiform.
Small bony fragments along the plantar medial aspect of the second
metatarsal base concerning for an avulsion fracture at the Lisfranc
ligament insertion

Normal alignment.

No other fracture or dislocation.

Ligaments

Suboptimally assessed by CT.

Muscles and Tendons

Muscles are normal. Flexor, extensor, peroneal and Achilles tendons
are grossly intact.

Soft tissues

No fluid collection or hematoma.
IMPRESSION: 1. Acute comminuted nondisplaced fracture of the medial cuneiform.
2. Small bony fragments along the plantar medial aspect of the
second metatarsal base concerning for an avulsion fracture at the
Lisfranc ligament insertion
